# Patient Record
Sex: Male | Born: 1965
Health system: Southern US, Community
[De-identification: ages and names within clinical notes are randomized; demographics above are authoritative.]

---

## 1988-02-18 HISTORY — PX: KNEE ARTHROSCOPY: SHX127

## 2012-08-19 ENCOUNTER — Ambulatory Visit (INDEPENDENT_AMBULATORY_CARE_PROVIDER_SITE_OTHER): Payer: BC Managed Care – PPO | Admitting: Physician Assistant

## 2012-08-19 VITALS — BP 118/72 | HR 81 | Temp 98.1°F | Resp 18 | Ht 67.5 in | Wt 193.4 lb

## 2012-08-19 DIAGNOSIS — L299 Pruritus, unspecified: Secondary | ICD-10-CM

## 2012-08-19 DIAGNOSIS — W57XXXA Bitten or stung by nonvenomous insect and other nonvenomous arthropods, initial encounter: Secondary | ICD-10-CM

## 2012-08-19 DIAGNOSIS — S60569A Insect bite (nonvenomous) of unspecified hand, initial encounter: Secondary | ICD-10-CM

## 2012-08-19 DIAGNOSIS — S60561A Insect bite (nonvenomous) of right hand, initial encounter: Secondary | ICD-10-CM

## 2012-08-19 MED ORDER — CLOBETASOL PROPIONATE 0.05 % EX CREA: TOPICAL_CREAM | Freq: Two times a day (BID) | CUTANEOUS | Status: DC

## 2012-08-19 NOTE — Progress Notes (Signed)
  Subjective:    Patient ID: JOSEY FORCIER, male    DOB: [birth year], 47 y.o.   MRN: [id number]  HPI Gerard Cantara is a 47 YO African American male presenting with an insect bite on his RIGHT wrist.  Occurred yesterday (7/2) at noon.  He saw an insect flying around and he believes it could have gone into his glove.  He felt the bite and took his glove off.  Since then, the area has become red, swollen and extremely itchy.  It is not painful or draining.  He has used nothing topically or systemically for relief.  Denies pain, fever, nausea, vomiting, and sore throat.  Past medical history, surgical history, medications, allergies, social history and family history have been reviewed.  Review of Systems As stated in HPI - otherwise negative.    Objective:   Physical Exam Filed Vitals:   08/19/12 1907  BP: 118/72  Pulse: 81  Temp: 98.1 F (36.7 C)  Resp: 18  General:  WDWN male in no acute distress. Skin:  Two areas with punctate wounds on right dorsum of the hand and right posterior forearm.  Erythematous, edematous, hot, indurated area spreading across the area.      Assessment & Plan:  1. Insect bite hand, right, initial encounter 2. Itching  Recommend patient stay hydrated, keep cool, and take OTC Benadryl at night and Claritin in the morning.    Prescribe:  clobetasol cream (TEMOVATE) 0.05 %; Apply topically 2 (two) times daily.  Dispense: 30 g; Refill: 0.  Call office if symptoms do not steadily improve or any acute worsening including fever.

## 2012-08-19 NOTE — Progress Notes (Signed)
I have examined this patient along with the student and agree.  

## 2012-08-19 NOTE — Patient Instructions (Addendum)
Stay as cool and dry as possible - as getting hot and sweaty will increase your itching.   Stay hydrated to help your body cool.  Keep the right arm/hand elevated as much as possible.   Use OTC Benadryl 50 mg at bedtime.  Use OTC Claritin (Loratadine) 10 mg each morning.  These will help with your itching.   Return if you develop pain, drainage, increasing redness, increasing swelling or if you begin to run a fever above 101F as these may indicate the development of an infection.

## 2015-04-04 ENCOUNTER — Ambulatory Visit (INDEPENDENT_AMBULATORY_CARE_PROVIDER_SITE_OTHER): Payer: BLUE CROSS/BLUE SHIELD | Admitting: Family Medicine

## 2015-04-04 ENCOUNTER — Ambulatory Visit (INDEPENDENT_AMBULATORY_CARE_PROVIDER_SITE_OTHER): Payer: BLUE CROSS/BLUE SHIELD

## 2015-04-04 VITALS — BP 130/88 | HR 94 | Temp 100.4°F | Resp 16 | Ht 67.5 in | Wt 203.2 lb

## 2015-04-04 DIAGNOSIS — K5900 Constipation, unspecified: Secondary | ICD-10-CM

## 2015-04-04 DIAGNOSIS — R197 Diarrhea, unspecified: Secondary | ICD-10-CM

## 2015-04-04 DIAGNOSIS — R6881 Early satiety: Secondary | ICD-10-CM

## 2015-04-04 DIAGNOSIS — R1013 Epigastric pain: Secondary | ICD-10-CM

## 2015-04-04 DIAGNOSIS — K429 Umbilical hernia without obstruction or gangrene: Secondary | ICD-10-CM | POA: Diagnosis not present

## 2015-04-04 DIAGNOSIS — R112 Nausea with vomiting, unspecified: Secondary | ICD-10-CM

## 2015-04-04 DIAGNOSIS — M6208 Separation of muscle (nontraumatic), other site: Secondary | ICD-10-CM

## 2015-04-04 DIAGNOSIS — A09 Infectious gastroenteritis and colitis, unspecified: Secondary | ICD-10-CM

## 2015-04-04 LAB — POCT CBC
Granulocyte percent: 76.2 %G (ref 37–80)
HEMATOCRIT: 39.5 % — AB (ref 43.5–53.7)
HEMOGLOBIN: 13.7 g/dL — AB (ref 14.1–18.1)
LYMPH, POC: 1.6 (ref 0.6–3.4)
MCH, POC: 29.5 pg (ref 27–31.2)
MCHC: 34.6 g/dL (ref 31.8–35.4)
MCV: 85.4 fL (ref 80–97)
MID (CBC): 0.2 (ref 0–0.9)
MPV: 6.7 fL (ref 0–99.8)
POC GRANULOCYTE: 5.6 (ref 2–6.9)
POC LYMPH %: 21.4 % (ref 10–50)
POC MID %: 2.4 % (ref 0–12)
Platelet Count, POC: 265 10*3/uL (ref 142–424)
RBC: 4.63 M/uL — AB (ref 4.69–6.13)
RDW, POC: 13.7 %
WBC: 7.4 10*3/uL (ref 4.6–10.2)

## 2015-04-04 LAB — HEMOCCULT GUIAC POC 1CARD (OFFICE): Fecal Occult Blood, POC: NEGATIVE

## 2015-04-04 MED ORDER — POLYETHYLENE GLYCOL 3350 17 GM/SCOOP PO POWD: ORAL | Status: DC

## 2015-04-04 NOTE — Patient Instructions (Addendum)
Because you received an x-ray today, you will receive an invoice from [facility]. Please contact [facility] at [phone] with questions or concerns regarding your invoice. Our billing staff will not be able to assist you with those questions.  I recommend doing a miralax clean-out. Put 14 (not kidding) doses of miralax (polyethylene glycol) into 64 oz of any clear, non-carbonated liquid (apple juice, gatorade, water) and drink this WITHIN 24 HOURS!!!! For the next 2d, plan to stay home and just hang around the toilet as you will hopefully be having 8 BM/day of LIQUID stool.  If the diarrhea occurs soon after starting process without passing a significant stool volume or if you are having any fecal incontinence you should keep going as this may be the initial miralax washing around the larger stools.  In the meantime, before you start the miralax cleanout on Friday you can try a bottle of magnesium citrate to get things started.   You can also take a large amount of colace/docusate - which works as a Development worker, community but wont push things through. . . I think you are likely beyond this being functional. Senna will also work well to push things through but will cause a lot of abdominal cramping - make it a painful process but effective  Constipation, Adult Constipation is when a person has fewer than three bowel movements a week, has difficulty having a bowel movement, or has stools that are dry, hard, or larger than normal. As people grow older, constipation is more common. A low-fiber diet, not taking in enough fluids, and taking certain medicines may make constipation worse.  CAUSES   Certain medicines, such as antidepressants, pain medicine, iron supplements, antacids, and water pills.   Certain diseases, such as diabetes, irritable bowel syndrome (IBS), thyroid disease, or depression.   Not drinking enough water.   Not eating enough fiber-rich foods.   Stress or travel.    Lack of physical activity or exercise.   Ignoring the urge to have a bowel movement.   Using laxatives too much.  SIGNS AND SYMPTOMS   Having fewer than three bowel movements a week.   Straining to have a bowel movement.   Having stools that are hard, dry, or larger than normal.   Feeling full or bloated.   Pain in the lower abdomen.   Not feeling relief after having a bowel movement.  DIAGNOSIS  Your health care provider will take a medical history and perform a physical exam. Further testing may be done for severe constipation. Some tests may include:  A barium enema X-ray to examine your rectum, colon, and, sometimes, your small intestine.   A sigmoidoscopy to examine your lower colon.   A colonoscopy to examine your entire colon. TREATMENT  Treatment will depend on the severity of your constipation and what is causing it. Some dietary treatments include drinking more fluids and eating more fiber-rich foods. Lifestyle treatments may include regular exercise. If these diet and lifestyle recommendations do not help, your health care provider may recommend taking over-the-counter laxative medicines to help you have bowel movements. Prescription medicines may be prescribed if over-the-counter medicines do not work.  HOME CARE INSTRUCTIONS   Eat foods that have a lot of fiber, such as fruits, vegetables, whole grains, and beans.  Limit foods high in fat and processed sugars, such as french fries, hamburgers, cookies, candies, and soda.   A fiber supplement may be added to your diet if you cannot get enough fiber from  foods.   Drink enough fluids to keep your urine clear or pale yellow.   Exercise regularly or as directed by your health care provider.   Go to the restroom when you have the urge to go. Do not hold it.   Only take over-the-counter or prescription medicines as directed by your health care provider. Do not take other medicines for constipation  without talking to your health care provider first.  SEEK IMMEDIATE MEDICAL CARE IF:   You have bright red blood in your stool.   Your constipation lasts for more than 4 days or gets worse.   You have abdominal or rectal pain.   You have thin, pencil-like stools.   You have unexplained weight loss. MAKE SURE YOU:   Understand these instructions.  Will watch your condition.  Will get help right away if you are not doing well or get worse.   This information is not intended to replace advice given to you by your health care provider. Make sure you discuss any questions you have with your health care provider.   Document Released: 11/02/2003 Document Revised: 02/24/2014 Document Reviewed: 11/15/2012 Elsevier Interactive Patient Education 2016 [employer].  About Constipation  Constipation Overview Constipation is the most common gastrointestinal complaint - about 4 million Americans experience constipation and make 2.5 million physician visits a year to get help for the problem.  Constipation can occur when the colon absorbs too much water, the colon's muscle contraction is slow or sluggish, and/or there is delayed transit time through the colon.  The result is stool that is hard and dry.  Indicators of constipation include straining during bowel movements greater than 25% of the time, having fewer than three bowel movements per week, and/or the feeling of incomplete evacuation.  There are established guidelines ([place] II ) for defining constipation. A person needs to have two or more of the following symptoms for at least 12 weeks (not necessarily consecutive) in the preceding 12 months: . Straining in  greater than 25% of bowel movements . Lumpy or hard stools in greater than 25% of bowel movements . Sensation of incomplete emptying in greater than 25% of bowel movements . Sensation of anorectal obstruction/blockade in greater than 25% of bowel movements . Manual maneuvers to  help empty greater than 25% of bowel movements (e.g., digital evacuation, support of the pelvic floor)  . Less than  3 bowel movements/week . Loose stools are not present, and criteria for irritable bowel syndrome are insufficient  Common Causes of Constipation . Lack of fiber in your diet . Lack of physical activity . Medications, including iron and calcium supplements  . Dairy intake . Dehydration . Abuse of laxatives  Travel  Irritable Bowel Syndrome  Pregnancy  Luteal phase of menstruation (after ovulation and before menses)  Colorectal problems  Intestinal Dysfunction  Treating Constipation  There are several ways of treating constipation, including changes to diet and exercise, use of laxatives, adjustments to the pelvic floor, and scheduled toileting.  These treatments include: . increasing fiber and fluids in the diet  . increasing physical activity . learning muscle coordination   learning proper toileting techniques and toileting modifications   designing and sticking  to a toileting schedule     2007, [employer] Doc.22

## 2015-04-04 NOTE — Progress Notes (Signed)
Subjective:  By signing my name below, I, Stann Ore, attest that this documentation has been prepared under the direction and in the presence of Norberto Sorenson, MD. Electronically Signed: Stann Ore, Scribe. 04/04/2015 , 6:11 PM .  Patient was seen in Room 1 .   Patient ID: Joseph Nolan, male    DOB: 07-Mar-1965, 50 y.o.   MRN: [id number] Chief Complaint  Patient presents with  . Abdominal Pain    x 1 week  . Nausea    x 1 week  . Diarrhea    x 1 week   HPI Joseph Nolan is a 50 y.o. male who presents to [facility] complaining of abdominal pain with nausea and diarrhea that started about 10 days ago. It started with really bad diarrhea (looser and mostly liquid) and then started vomiting. He felt better after 2 days but it returned. He mentions some constipation in between. Now, when he eats, he has cramping abdominal pain (over the epigastric area). His temperature has been up. He denies blood in stool or urinary symptoms. He denies bowel problems in the past. He denies abdomen surgeries. He now gets full really quick and can't eat as much as he used to. He denies weight change.   He's been eating chicken and fish. He has more cramping abdominal pain when he eats cheese.   History reviewed. No pertinent past medical history. Prior to Admission medications   Medication Sig Start Date End Date Taking? Authorizing Provider  clobetasol cream (TEMOVATE) 0.05 % Apply topically 2 (two) times daily. Patient not taking: Reported on 04/04/2015 08/19/12   Porfirio Oar, PA-C  Multiple Vitamin (MULTIVITAMIN) tablet Take 1 tablet by mouth daily. Reported on 04/04/2015    Historical Provider, MD   No Known Allergies  Review of Systems  Constitutional: Positive for fever. Negative for chills, diaphoresis and fatigue.  Cardiovascular: Negative for chest pain.  Gastrointestinal: Positive for nausea, vomiting, abdominal pain, diarrhea and constipation. Negative for blood in stool and anal bleeding.    Genitourinary: Negative for dysuria and hematuria.      Objective:   Physical Exam  Constitutional: He is oriented to person, place, and time. He appears well-developed and well-nourished. No distress.  HENT:  Head: Normocephalic and atraumatic.  Eyes: EOM are normal. Pupils are equal, round, and reactive to light.  Neck: Neck supple.  Cardiovascular: Normal rate, regular rhythm, S1 normal, S2 normal and normal heart sounds.   No murmur heard. Pulmonary/Chest: Effort normal and breath sounds normal. No respiratory distress. He has no wheezes.  Abdominal: Soft. He exhibits no distension. Bowel sounds are decreased. There is no hepatosplenomegaly. There is no CVA tenderness and negative Murphy's sign.  Tympanitic bowel sounds in epigastrium, diastasis recti with umbilical hernia  Musculoskeletal: Normal range of motion.  Neurological: He is alert and oriented to person, place, and time.  Skin: Skin is warm and dry.  Psychiatric: He has a normal mood and affect. His behavior is normal.  Nursing note and vitals reviewed.   BP 130/88 mmHg  Pulse 94  Temp(Src) 100.4 F (38 C) (Oral)  Resp 16  Ht 5' 7.5" (1.715 m)  Wt 203 lb 3.2 oz (92.171 kg)  BMI 31.34 kg/m2  SpO2 97%   Results for orders placed or performed in visit on 04/04/15  POCT CBC  Result Value Ref Range   WBC 7.4 4.6 - 10.2 K/uL   Lymph, poc 1.6 0.6 - 3.4   POC LYMPH PERCENT 21.4 10 -  50 %L   MID (cbc) 0.2 0 - 0.9   POC MID % 2.4 0 - 12 %M   POC Granulocyte 5.6 2 - 6.9   Granulocyte percent 76.2 37 - 80 %G   RBC 4.63 (A) 4.69 - 6.13 M/uL   Hemoglobin 13.7 (A) 14.1 - 18.1 g/dL   HCT, POC 44.0 (A) 10.2 - 53.7 %   MCV 85.4 80 - 97 fL   MCH, POC 29.5 27 - 31.2 pg   MCHC 34.6 31.8 - 35.4 g/dL   RDW, POC 72.5 %   Platelet Count, POC 265 142 - 424 K/uL   MPV 6.7 0 - 99.8 fL   Dg Abd 2 Views  04/04/2015  CLINICAL DATA:  50 year old male with epigastric pain and cramping. EXAM: ABDOMEN - 2 VIEW COMPARISON:  None.  FINDINGS: There is moderate stool throughout the colon. No evidence of bowel obstruction. No free air. No radiopaque calculi. No acute osseous pathology identified. The soft tissues appear unremarkable. IMPRESSION: Constipation.  No bowel obstruction. Electronically Signed   By: Elgie Collard M.D.   On: 04/04/2015 18:29       Assessment & Plan:   1. Abdominal pain, epigastric   2. Non-intractable vomiting with nausea, unspecified vomiting type   3. Diarrhea of presumed infectious origin   4. Early satiety   5. Diastasis recti   6. Umbilical hernia without obstruction and without gangrene   7. Constipation, unspecified constipation type - try miralax cleanout and add in mag citrate prior if needed.    Orders Placed This Encounter  Procedures  . DG Abd 2 Views    Standing Status: Future     Number of Occurrences: 1     Standing Expiration Date: 04/03/2016    Order Specific Question:  Reason for Exam (SYMPTOM  OR DIAGNOSIS REQUIRED)    Answer:  epigastric cramping decreased tympanitic bowel sounds    Order Specific Question:  Preferred imaging location?    Answer:  External  . Comprehensive metabolic panel  . Fecal lactoferrin  . Lipase  . Ambulatory referral to Gastroenterology    Referral Priority:  Routine    Referral Type:  Consultation    Referral Reason:  Specialty Services Required    Number of Visits Requested:  1  . POCT CBC  . POCT occult blood stool    Meds ordered this encounter  Medications  . polyethylene glycol powder (GLYCOLAX/MIRALAX) powder    Sig: Use as directed by physician    Dispense:  500 g    Refill:  1    I personally performed the services described in this documentation, which was scribed in my presence. The recorded information has been reviewed and considered, and addended by me as needed.  Norberto Sorenson, MD MPH

## 2015-04-05 LAB — COMPREHENSIVE METABOLIC PANEL
ALT: 19 U/L (ref 9–46)
AST: 17 U/L (ref 10–40)
Albumin: 4 g/dL (ref 3.6–5.1)
Alkaline Phosphatase: 84 U/L (ref 40–115)
BUN: 12 mg/dL (ref 7–25)
CHLORIDE: 104 mmol/L (ref 98–110)
CO2: 27 mmol/L (ref 20–31)
CREATININE: 1.13 mg/dL (ref 0.60–1.35)
Calcium: 8.8 mg/dL (ref 8.6–10.3)
GLUCOSE: 100 mg/dL — AB (ref 65–99)
Potassium: 4.1 mmol/L (ref 3.5–5.3)
SODIUM: 139 mmol/L (ref 135–146)
TOTAL PROTEIN: 7.1 g/dL (ref 6.1–8.1)
Total Bilirubin: 0.4 mg/dL (ref 0.2–1.2)

## 2015-04-05 LAB — LIPASE: LIPASE: 27 U/L (ref 7–60)

## 2015-04-11 ENCOUNTER — Encounter: Payer: Self-pay | Admitting: Family Medicine

## 2016-07-03 IMAGING — CR DG ABDOMEN 2V
2 series · 2 of 2 positions shown · non-contrast
Comparison: None.

CLINICAL DATA: 49-year-old male with epigastric pain and cramping.

EXAM:
ABDOMEN - 2 VIEW

[AP (1 of 2)]
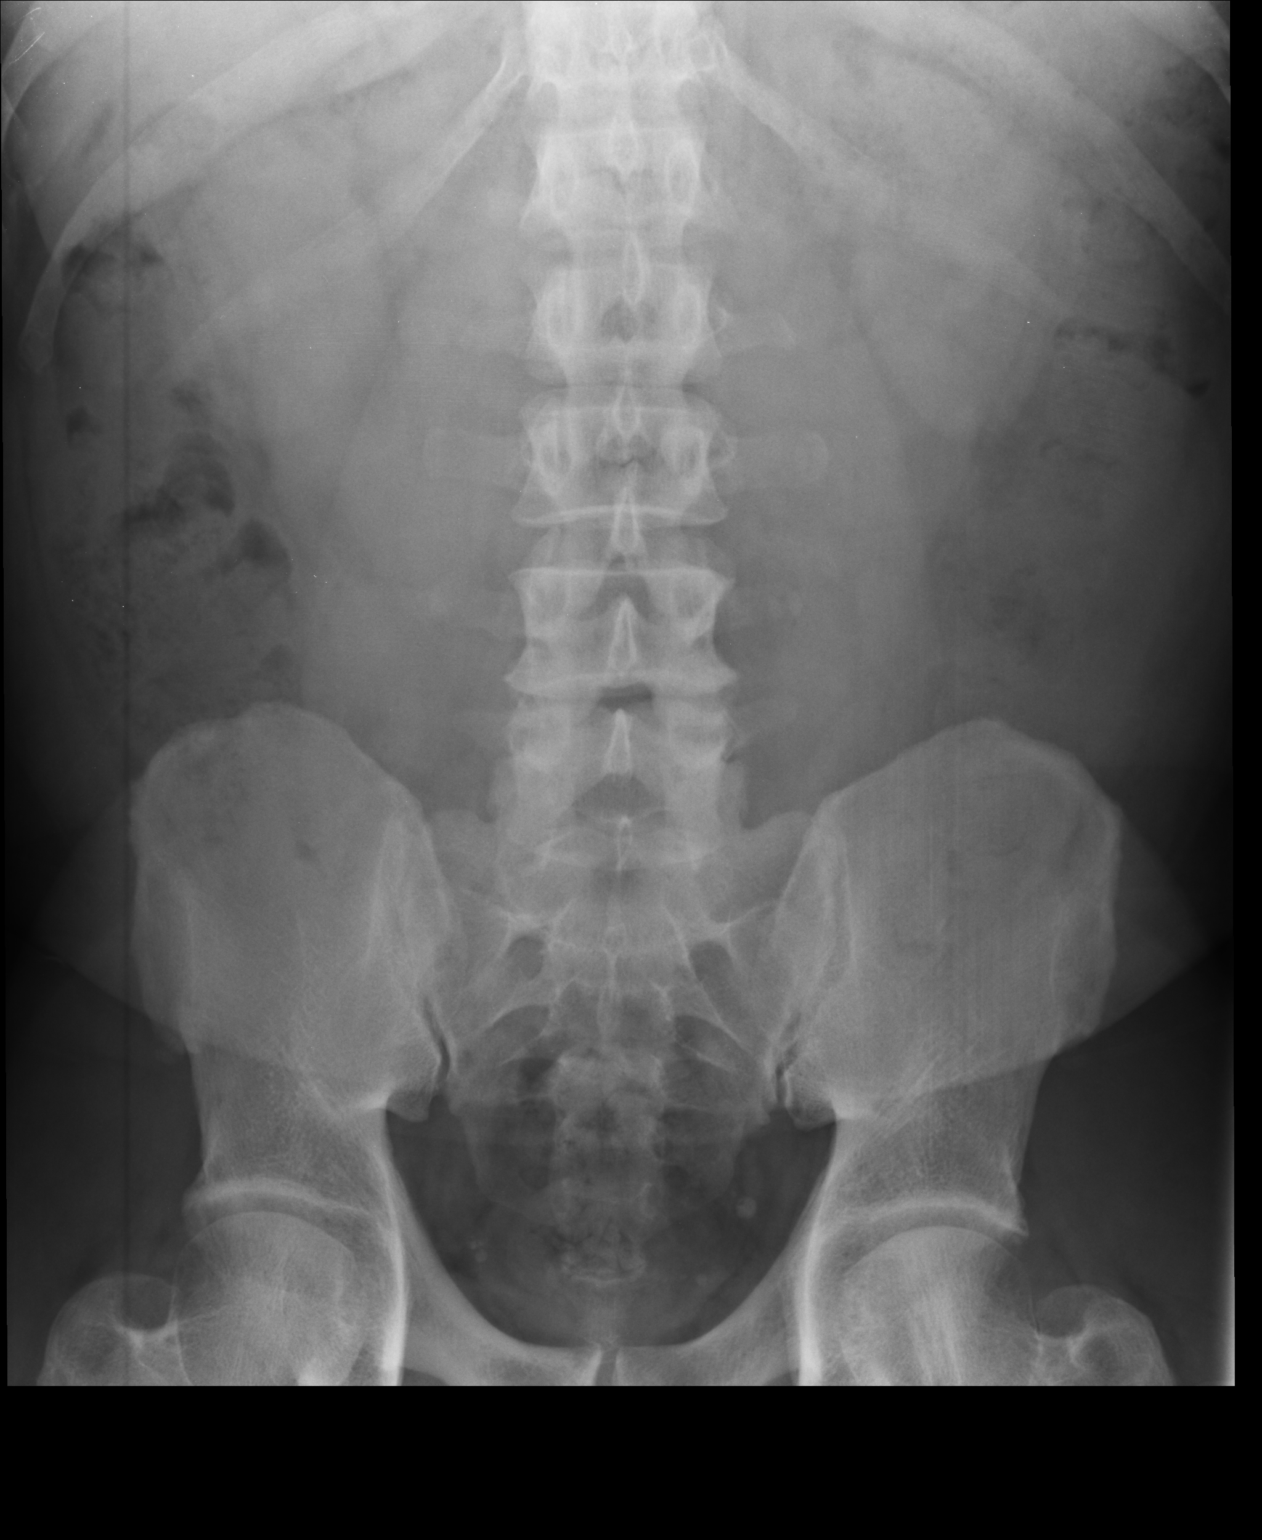

[AP (2 of 2)]
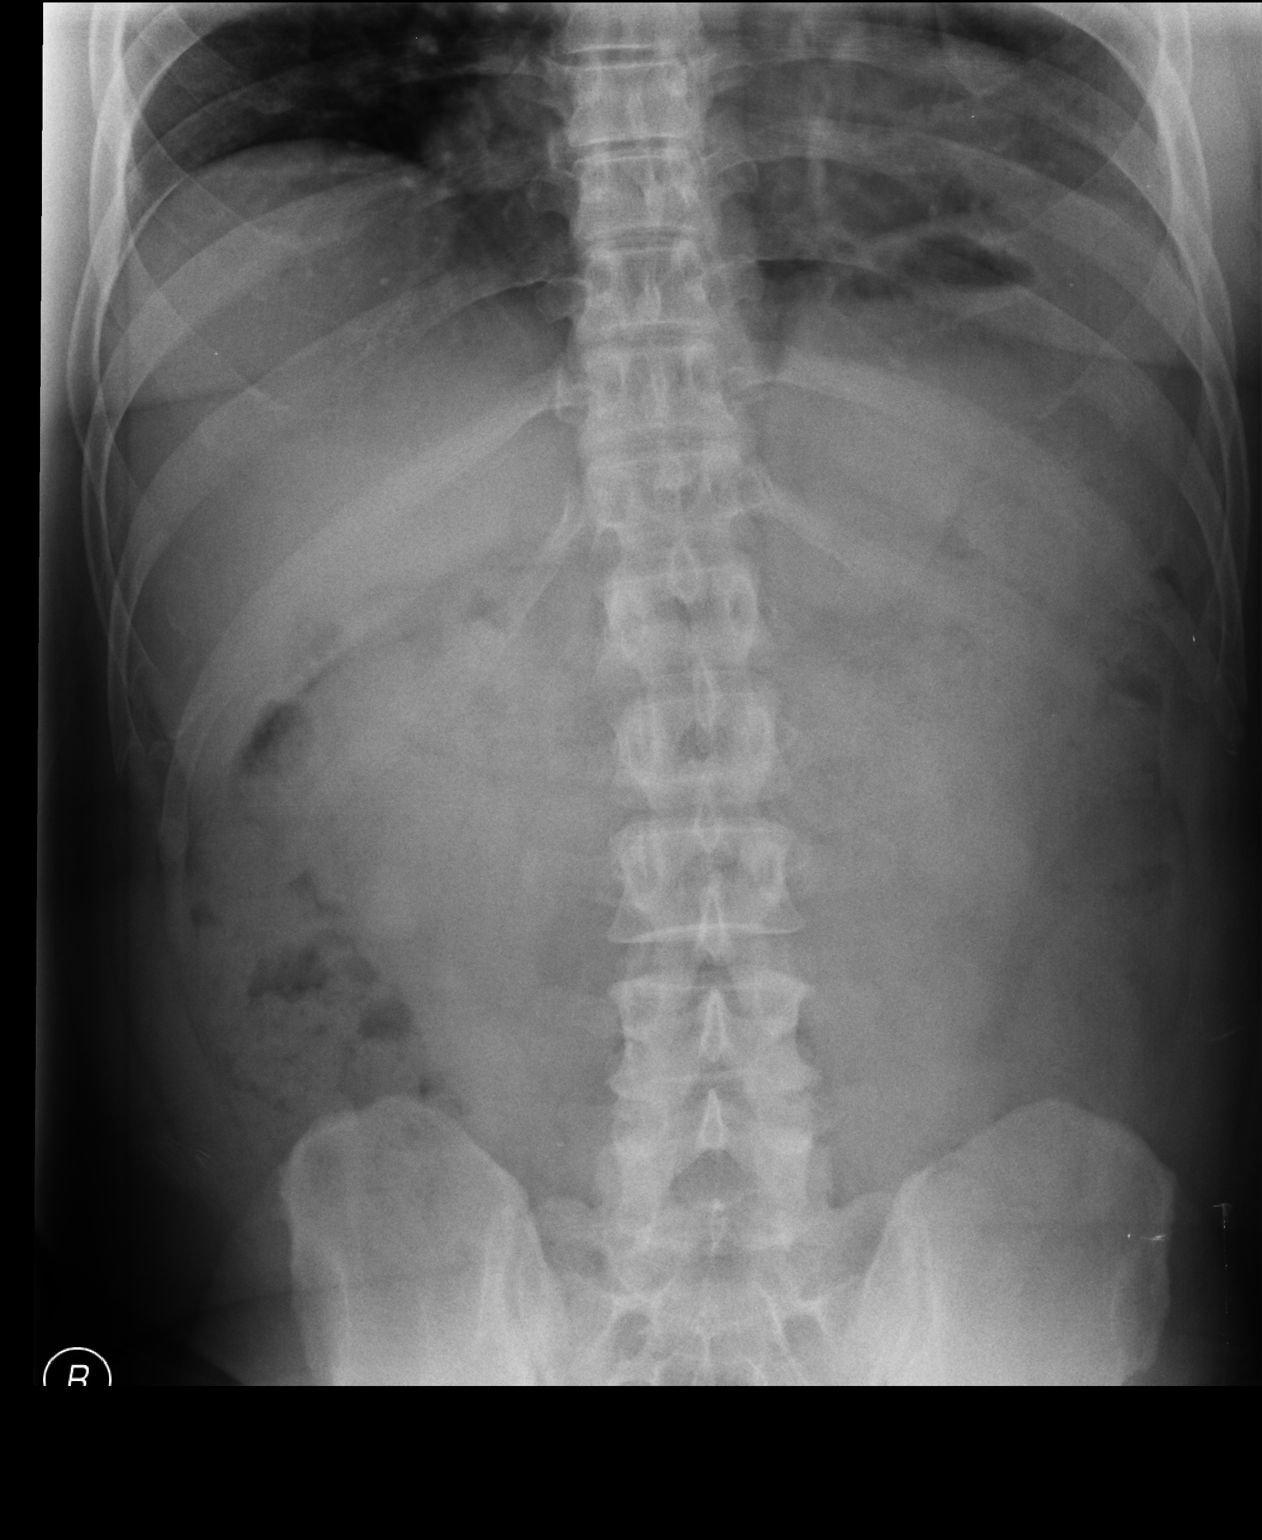

[2 of 2 positions shown; findings below may reference images not displayed]

FINDINGS: There is moderate stool throughout the colon. No evidence of bowel
obstruction. No free air. No radiopaque calculi. No acute osseous
pathology identified. The soft tissues appear unremarkable.
IMPRESSION: Constipation.  No bowel obstruction.

## 2017-04-15 ENCOUNTER — Ambulatory Visit (INDEPENDENT_AMBULATORY_CARE_PROVIDER_SITE_OTHER): Payer: BLUE CROSS/BLUE SHIELD | Admitting: Emergency Medicine

## 2017-04-15 ENCOUNTER — Encounter: Payer: Self-pay | Admitting: Emergency Medicine

## 2017-04-15 VITALS — BP 124/80 | HR 79 | Temp 98.5°F | Resp 17 | Ht 69.0 in | Wt 208.0 lb

## 2017-04-15 DIAGNOSIS — Z0000 Encounter for general adult medical examination without abnormal findings: Secondary | ICD-10-CM

## 2017-04-15 NOTE — Progress Notes (Signed)
Joseph Nolan 52 y.o.   Chief Complaint  Patient presents with  . Annual Exam    HISTORY OF PRESENT ILLNESS: This is a 52 y.o. male Here for annual exam; no complaints and no medical concerns. Smoking: No Drinking: No Sleeping: Adequate Work: Regular hours 10-12 hours a day Exercise: Adequate Stress: Low Nutrition: Average.  Intolerant to red meats.   HPI   Prior to Admission medications   Not on File    No Known Allergies  There are no active problems to display for this patient.   No past medical history on file.  Past Surgical History:  Procedure Laterality Date  . KNEE ARTHROSCOPY  1990    Social History   Socioeconomic History  . Marital status: Single    Spouse name: Not on file  . Number of children: Not on file  . Years of education: Not on file  . Highest education level: Not on file  Social Needs  . Financial resource strain: Not on file  . Food insecurity - worry: Not on file  . Food insecurity - inability: Not on file  . Transportation needs - medical: Not on file  . Transportation needs - non-medical: Not on file  Occupational History    Comment: Lawyer, Truck Driver  Tobacco Use  . Smoking status: Former Smoker    Packs/day: 0.50    Years: 17.00    Pack years: 8.50    Last attempt to quit: 12/21/2003    Years since quitting: 13.3  . Smokeless tobacco: Never Used  Substance and Sexual Activity  . Alcohol use: Yes    Alcohol/week: 0.0 - 1.0 oz  . Drug use: No  . Sexual activity: Not on file  Other Topics Concern  . Not on file  Social History Narrative   Lives at home with Joseph Nolan mother and Joseph Nolan son.    Family History  Problem Relation Age of Onset  . Arthritis Mother   . Cancer Father 32       widespread mets, unknown of original origin  . Cancer Maternal Aunt   . Cancer Maternal Uncle   . Cancer Maternal Aunt   . Cancer Maternal Uncle      Review of Systems  Constitutional: Negative.  Negative for chills, fever  and weight loss.  HENT: Negative.  Negative for congestion, nosebleeds and sore throat.   Eyes: Negative.  Negative for blurred vision and double vision.  Respiratory: Negative.  Negative for cough and shortness of breath.   Cardiovascular: Negative.  Negative for chest pain and palpitations.  Gastrointestinal: Positive for abdominal pain (Occasional epigastric discomfort), diarrhea (After eating red meat) and nausea (Related to red meat ingestion). Negative for blood in stool and melena.  Genitourinary: Negative.  Negative for dysuria and hematuria.  Musculoskeletal: Negative.   Skin: Negative.   Neurological: Negative.  Negative for dizziness and headaches.  Endo/Heme/Allergies: Negative.     Vitals:   04/15/17 1357  BP: 124/80  Pulse: 79  Resp: 17  Temp: 98.5 F (36.9 C)  SpO2: 98%    Physical Exam  Constitutional: Joseph Nolan is oriented to person, place, and time. Joseph Nolan appears well-developed and well-nourished.  HENT:  Head: Normocephalic.  Right Ear: External ear normal.  Left Ear: External ear normal.  Nose: Nose normal.  Mouth/Throat: Oropharynx is clear and moist.  Eyes: Conjunctivae and EOM are normal. Pupils are equal, round, and reactive to light.  Neck: Normal range of motion. Neck supple. No JVD present.  Carotid bruit is not present. No thyromegaly present.  Cardiovascular: Normal rate, regular rhythm and normal heart sounds.  Pulmonary/Chest: Effort normal and breath sounds normal. No respiratory distress.  Abdominal: Soft. Bowel sounds are normal. Joseph Nolan exhibits no distension. There is no tenderness.  Musculoskeletal: Normal range of motion.  Lymphadenopathy:    Joseph Nolan has no cervical adenopathy.  Neurological: Joseph Nolan is alert and oriented to person, place, and time. No sensory deficit. Joseph Nolan exhibits normal muscle tone.  Skin: Skin is warm and dry. Capillary refill takes less than 2 seconds. No rash noted.  Psychiatric: Joseph Nolan has a normal mood and affect. Joseph Nolan behavior is normal.    Vitals reviewed.    ASSESSMENT & PLAN: Joseph Nolan was seen today for annual exam.  Diagnoses and all orders for this visit:  Routine general medical examination at a health care facility -     CBC with Differential -     Comprehensive metabolic panel -     Hemoglobin A1c -     Lipid panel -     PSA(Must document that pt has been informed of limitations of PSA testing.) -     HIV antibody    Patient Instructions       IF you received an x-ray today, you will receive an invoice from [facility]. Please contact [facility] at [phone] with questions or concerns regarding your invoice.   IF you received labwork today, you will receive an invoice from [facility]. Please contact [facility] at [phone] with questions or concerns regarding your invoice.   Our billing staff will not be able to assist you with questions regarding bills from these companies.  You will be contacted with the lab results as soon as they are available. The fastest way to get your results is to activate your My Chart account. Instructions are located on the last page of this paperwork. If you have not heard from [place] regarding the results in 2 weeks, please contact this office.       Health Maintenance, Male A healthy lifestyle and preventive care is important for your health and wellness. Ask your health care provider about what schedule of regular examinations is right for you. What should I know about weight and diet? Eat a Healthy Diet  Eat plenty of vegetables, fruits, whole grains, low-fat dairy products, and lean protein.  Do not eat a lot of foods high in solid fats, added sugars, or salt.  Maintain a Healthy Weight Regular exercise can help you achieve or maintain a healthy weight. You should:  Do at least 150 minutes of exercise each week. The exercise should increase your heart rate and make you sweat (moderate-intensity exercise).  Do strength-training exercises at  least twice a week.  Watch Your Levels of Cholesterol and Blood Lipids  Have your blood tested for lipids and cholesterol every 5 years starting at 50 years of age. If you are at high risk for heart disease, you should start having your blood tested when you are 50 years old. You may need to have your cholesterol levels checked more often if: ? Your lipid or cholesterol levels are high. ? You are older than 50 years of age. ? You are at high risk for heart disease.  What should I know about cancer screening? Many types of cancers can be detected early and may often be prevented. Lung Cancer  You should be screened every year for lung cancer if: ? You are a current smoker who has smoked for  at least 30 years. ? You are a former smoker who has quit within the past 15 years.  Talk to your health care provider about your screening options, when you should start screening, and how often you should be screened.  Colorectal Cancer  Routine colorectal cancer screening usually begins at 50 years of age and should be repeated every 5-10 years until you are 50 years old. You may need to be screened more often if early forms of precancerous polyps or small growths are found. Your health care provider may recommend screening at an earlier age if you have risk factors for colon cancer.  Your health care provider may recommend using home test kits to check for hidden blood in the stool.  A small camera at the end of a tube can be used to examine your colon (sigmoidoscopy or colonoscopy). This checks for the earliest forms of colorectal cancer.  Prostate and Testicular Cancer  Depending on your age and overall health, your health care provider may do certain tests to screen for prostate and testicular cancer.  Talk to your health care provider about any symptoms or concerns you have about testicular or prostate cancer.  Skin Cancer  Check your skin from head to toe regularly.  Tell your health  care provider about any new moles or changes in moles, especially if: ? There is a change in a mole's size, shape, or color. ? You have a mole that is larger than a pencil eraser.  Always use sunscreen. Apply sunscreen liberally and repeat throughout the day.  Protect yourself by wearing long sleeves, pants, a wide-brimmed hat, and sunglasses when outside.  What should I know about heart disease, diabetes, and high blood pressure?  If you are 78-30 years of age, have your blood pressure checked every 3-5 years. If you are 48 years of age or older, have your blood pressure checked every year. You should have your blood pressure measured twice-once when you are at a hospital or clinic, and once when you are not at a hospital or clinic. Record the average of the two measurements. To check your blood pressure when you are not at a hospital or clinic, you can use: ? An automated blood pressure machine at a pharmacy. ? A home blood pressure monitor.  Talk to your health care provider about your target blood pressure.  If you are between 46-24 years old, ask your health care provider if you should take aspirin to prevent heart disease.  Have regular diabetes screenings by checking your fasting blood sugar level. ? If you are at a normal weight and have a low risk for diabetes, have this test once every three years after the age of 86. ? If you are overweight and have a high risk for diabetes, consider being tested at a younger age or more often.  A one-time screening for abdominal aortic aneurysm (AAA) by ultrasound is recommended for men aged 65-75 years who are current or former smokers. What should I know about preventing infection? Hepatitis B If you have a higher risk for hepatitis B, you should be screened for this virus. Talk with your health care provider to find out if you are at risk for hepatitis B infection. Hepatitis C Blood testing is recommended for:  Everyone born from [birth year]  through 1965.  Anyone with known risk factors for hepatitis C.  Sexually Transmitted Diseases (STDs)  You should be screened each year for STDs including gonorrhea and chlamydia if: ? You  are sexually active and are younger than 50 years of age. ? You are older than 50 years of age and your health care provider tells you that you are at risk for this type of infection. ? Your sexual activity has changed since you were last screened and you are at an increased risk for chlamydia or gonorrhea. Ask your health care provider if you are at risk.  Talk with your health care provider about whether you are at high risk of being infected with HIV. Your health care provider may recommend a prescription medicine to help prevent HIV infection.  What else can I do?  Schedule regular health, dental, and eye exams.  Stay current with your vaccines (immunizations).  Do not use any tobacco products, such as cigarettes, chewing tobacco, and e-cigarettes. If you need help quitting, ask your health care provider.  Limit alcohol intake to no more than 2 drinks per day. One drink equals 12 ounces of beer, 5 ounces of wine, or 1 ounces of hard liquor.  Do not use street drugs.  Do not share needles.  Ask your health care provider for help if you need support or information about quitting drugs.  Tell your health care provider if you often feel depressed.  Tell your health care provider if you have ever been abused or do not feel safe at home. This information is not intended to replace advice given to you by your health care provider. Make sure you discuss any questions you have with your health care provider. Document Released: 08/02/2007 Document Revised: 10/03/2015 Document Reviewed: 11/07/2014 Elsevier Interactive Patient Education  2018 [employer].  American Heart Association (AHA) Exercise Recommendation  Being physically active is important to prevent heart disease and stroke, the nation's  No. 1and No. 5killers. To improve overall cardiovascular health, we suggest at least 150 minutes per week of moderate exercise or 75 minutes per week of vigorous exercise (or a combination of moderate and vigorous activity). Thirty minutes a day, five times a week is an easy goal to remember. You will also experience benefits even if you divide your time into two or three segments of 10 to 15 minutes per day.  For people who would benefit from lowering their blood pressure or cholesterol, we recommend 40 minutes of aerobic exercise of moderate to vigorous intensity three to four times a week to lower the risk for heart attack and stroke.  Physical activity is anything that makes you move your body and burn calories.  This includes things like climbing stairs or playing sports. Aerobic exercises benefit your heart, and include walking, jogging, swimming or biking. Strength and stretching exercises are best for overall stamina and flexibility.  The simplest, positive change you can make to effectively improve your heart health is to start walking. It's enjoyable, free, easy, social and great exercise. A walking program is flexible and boasts high success rates because people can stick with it. It's easy for walking to become a regular and satisfying part of life.   For Overall Cardiovascular Health:  At least 30 minutes of moderate-intensity aerobic activity at least 5 days per week for a total of 150  OR   At least 25 minutes of vigorous aerobic activity at least 3 days per week for a total of 75 minutes; or a combination of moderate- and vigorous-intensity aerobic activity  AND   Moderate- to high-intensity muscle-strengthening activity at least 2 days per week for additional health benefits.  For Lowering Blood Pressure  and Cholesterol  An average 40 minutes of moderate- to vigorous-intensity aerobic activity 3 or 4 times per week  What if I can't make it to the time goal? Something is  always better than nothing! And everyone has to start somewhere. Even if you've been sedentary for years, today is the day you can begin to make healthy changes in your life. If you don't think you'll make it for 30 or 40 minutes, set a reachable goal for today. You can work up toward your overall goal by increasing your time as you get stronger. Don't let all-or-nothing thinking rob you of doing what you can every day.  Source:http://www.heart.[place]        Jeshurun [place], MD [facility]

## 2017-04-15 NOTE — Patient Instructions (Addendum)
   IF you received an x-ray today, you will receive an invoice from [facility]. Please contact [facility] at [phone] with questions or concerns regarding your invoice.   IF you received labwork today, you will receive an invoice from [facility]. Please contact [facility] at [phone] with questions or concerns regarding your invoice.   Our billing staff will not be able to assist you with questions regarding bills from these companies.  You will be contacted with the lab results as soon as they are available. The fastest way to get your results is to activate your My Chart account. Instructions are located on the last page of this paperwork. If you have not heard from us regarding the results in 2 weeks, please contact this office.      Health Maintenance, Male A healthy lifestyle and preventive care is important for your health and wellness. Ask your health care provider about what schedule of regular examinations is right for you. What should I know about weight and diet? Eat a Healthy Diet  Eat plenty of vegetables, fruits, whole grains, low-fat dairy products, and lean protein.  Do not eat a lot of foods high in solid fats, added sugars, or salt.  Maintain a Healthy Weight Regular exercise can help you achieve or maintain a healthy weight. You should:  Do at least 150 minutes of exercise each week. The exercise should increase your heart rate and make you sweat (moderate-intensity exercise).  Do strength-training exercises at least twice a week.  Watch Your Levels of Cholesterol and Blood Lipids  Have your blood tested for lipids and cholesterol every 5 years starting at 50 years of age. If you are at high risk for heart disease, you should start having your blood tested when you are 50 years old. You may need to have your cholesterol levels checked more often if: ? Your lipid or cholesterol levels are high. ? You are older than 50 years of age. ? You  are at high risk for heart disease.  What should I know about cancer screening? Many types of cancers can be detected early and may often be prevented. Lung Cancer  You should be screened every year for lung cancer if: ? You are a current smoker who has smoked for at least 30 years. ? You are a former smoker who has quit within the past 15 years.  Talk to your health care provider about your screening options, when you should start screening, and how often you should be screened.  Colorectal Cancer  Routine colorectal cancer screening usually begins at 50 years of age and should be repeated every 5-10 years until you are 50 years old. You may need to be screened more often if early forms of precancerous polyps or small growths are found. Your health care provider may recommend screening at an earlier age if you have risk factors for colon cancer.  Your health care provider may recommend using home test kits to check for hidden blood in the stool.  A small camera at the end of a tube can be used to examine your colon (sigmoidoscopy or colonoscopy). This checks for the earliest forms of colorectal cancer.  Prostate and Testicular Cancer  Depending on your age and overall health, your health care provider may do certain tests to screen for prostate and testicular cancer.  Talk to your health care provider about any symptoms or concerns you have about testicular or prostate cancer.  Skin Cancer  Check your skin   from head to toe regularly.  Tell your health care provider about any new moles or changes in moles, especially if: ? There is a change in a mole's size, shape, or color. ? You have a mole that is larger than a pencil eraser.  Always use sunscreen. Apply sunscreen liberally and repeat throughout the day.  Protect yourself by wearing long sleeves, pants, a wide-brimmed hat, and sunglasses when outside.  What should I know about heart disease, diabetes, and high blood  pressure?  If you are 18-39 years of age, have your blood pressure checked every 3-5 years. If you are 40 years of age or older, have your blood pressure checked every year. You should have your blood pressure measured twice-once when you are at a hospital or clinic, and once when you are not at a hospital or clinic. Record the average of the two measurements. To check your blood pressure when you are not at a hospital or clinic, you can use: ? An automated blood pressure machine at a pharmacy. ? A home blood pressure monitor.  Talk to your health care provider about your target blood pressure.  If you are between 45-79 years old, ask your health care provider if you should take aspirin to prevent heart disease.  Have regular diabetes screenings by checking your fasting blood sugar level. ? If you are at a normal weight and have a low risk for diabetes, have this test once every three years after the age of 45. ? If you are overweight and have a high risk for diabetes, consider being tested at a younger age or more often.  A one-time screening for abdominal aortic aneurysm (AAA) by ultrasound is recommended for men aged 65-75 years who are current or former smokers. What should I know about preventing infection? Hepatitis B If you have a higher risk for hepatitis B, you should be screened for this virus. Talk with your health care provider to find out if you are at risk for hepatitis B infection. Hepatitis C Blood testing is recommended for:  Everyone born from 1945 through 1965.  Anyone with known risk factors for hepatitis C.  Sexually Transmitted Diseases (STDs)  You should be screened each year for STDs including gonorrhea and chlamydia if: ? You are sexually active and are younger than 50 years of age. ? You are older than 50 years of age and your health care provider tells you that you are at risk for this type of infection. ? Your sexual activity has changed since you were last  screened and you are at an increased risk for chlamydia or gonorrhea. Ask your health care provider if you are at risk.  Talk with your health care provider about whether you are at high risk of being infected with HIV. Your health care provider may recommend a prescription medicine to help prevent HIV infection.  What else can I do?  Schedule regular health, dental, and eye exams.  Stay current with your vaccines (immunizations).  Do not use any tobacco products, such as cigarettes, chewing tobacco, and e-cigarettes. If you need help quitting, ask your health care provider.  Limit alcohol intake to no more than 2 drinks per day. One drink equals 12 ounces of beer, 5 ounces of wine, or 1 ounces of hard liquor.  Do not use street drugs.  Do not share needles.  Ask your health care provider for help if you need support or information about quitting drugs.  Tell your health care   provider if you often feel depressed.  Tell your health care provider if you have ever been abused or do not feel safe at home. This information is not intended to replace advice given to you by your health care provider. Make sure you discuss any questions you have with your health care provider. Document Released: 08/02/2007 Document Revised: 10/03/2015 Document Reviewed: 11/07/2014 Elsevier Interactive Patient Education  2018 [employer]  American Heart Association (AHA) Exercise Recommendation  Being physically active is important to prevent heart disease and stroke, the nation's No. 1and No. 5killers. To improve overall cardiovascular health, we suggest at least 150 minutes per week of moderate exercise or 75 minutes per week of vigorous exercise (or a combination of moderate and vigorous activity). Thirty minutes a day, five times a week is an easy goal to remember. You will also experience benefits even if you divide your time into two or three segments of 10 to 15 minutes per day.  For people who would  benefit from lowering their blood pressure or cholesterol, we recommend 40 minutes of aerobic exercise of moderate to vigorous intensity three to four times a week to lower the risk for heart attack and stroke.  Physical activity is anything that makes you move your body and burn calories.  This includes things like climbing stairs or playing sports. Aerobic exercises benefit your heart, and include walking, jogging, swimming or biking. Strength and stretching exercises are best for overall stamina and flexibility.  The simplest, positive change you can make to effectively improve your heart health is to start walking. It's enjoyable, free, easy, social and great exercise. A walking program is flexible and boasts high success rates because people can stick with it. It's easy for walking to become a regular and satisfying part of life.   For Overall Cardiovascular Health:  At least 30 minutes of moderate-intensity aerobic activity at least 5 days per week for a total of 150  OR   At least 25 minutes of vigorous aerobic activity at least 3 days per week for a total of 75 minutes; or a combination of moderate- and vigorous-intensity aerobic activity  AND   Moderate- to high-intensity muscle-strengthening activity at least 2 days per week for additional health benefits.  For Lowering Blood Pressure and Cholesterol  An average 40 minutes of moderate- to vigorous-intensity aerobic activity 3 or 4 times per week  What if I can't make it to the time goal? Something is always better than nothing! And everyone has to start somewhere. Even if you've been sedentary for years, today is the day you can begin to make healthy changes in your life. If you don't think you'll make it for 30 or 40 minutes, set a reachable goal for today. You can work up toward your overall goal by increasing your time as you get stronger. Don't let all-or-nothing thinking rob you of doing what you can every day.   Source:http://www.heart.org    

## 2017-04-16 LAB — CBC WITH DIFFERENTIAL/PLATELET
BASOS ABS: 0 10*3/uL (ref 0.0–0.2)
Basos: 0 %
EOS (ABSOLUTE): 0.1 10*3/uL (ref 0.0–0.4)
Eos: 2 %
Hematocrit: 40 % (ref 37.5–51.0)
Hemoglobin: 13.8 g/dL (ref 13.0–17.7)
Immature Grans (Abs): 0 10*3/uL (ref 0.0–0.1)
Immature Granulocytes: 0 %
LYMPHS ABS: 1.7 10*3/uL (ref 0.7–3.1)
Lymphs: 30 %
MCH: 29.5 pg (ref 26.6–33.0)
MCHC: 34.5 g/dL (ref 31.5–35.7)
MCV: 86 fL (ref 79–97)
Monocytes Absolute: 0.5 10*3/uL (ref 0.1–0.9)
Monocytes: 9 %
NEUTROS ABS: 3.3 10*3/uL (ref 1.4–7.0)
Neutrophils: 59 %
PLATELETS: 284 10*3/uL (ref 150–379)
RBC: 4.68 x10E6/uL (ref 4.14–5.80)
RDW: 13.7 % (ref 12.3–15.4)
WBC: 5.7 10*3/uL (ref 3.4–10.8)

## 2017-04-16 LAB — COMPREHENSIVE METABOLIC PANEL
A/G RATIO: 1.4 (ref 1.2–2.2)
ALBUMIN: 4.5 g/dL (ref 3.5–5.5)
ALT: 28 IU/L (ref 0–44)
AST: 18 IU/L (ref 0–40)
Alkaline Phosphatase: 91 IU/L (ref 39–117)
BILIRUBIN TOTAL: 0.4 mg/dL (ref 0.0–1.2)
BUN / CREAT RATIO: 12 (ref 9–20)
BUN: 13 mg/dL (ref 6–24)
CHLORIDE: 104 mmol/L (ref 96–106)
CO2: 26 mmol/L (ref 20–29)
Calcium: 9.4 mg/dL (ref 8.7–10.2)
Creatinine, Ser: 1.1 mg/dL (ref 0.76–1.27)
GFR calc non Af Amer: 78 mL/min/{1.73_m2} (ref >59)
GFR, EST AFRICAN AMERICAN: 91 mL/min/{1.73_m2} (ref >59)
GLUCOSE: 86 mg/dL (ref 65–99)
Globulin, Total: 3.2 g/dL (ref 1.5–4.5)
POTASSIUM: 4.3 mmol/L (ref 3.5–5.2)
Sodium: 141 mmol/L (ref 134–144)
TOTAL PROTEIN: 7.7 g/dL (ref 6.0–8.5)

## 2017-04-16 LAB — PSA: PROSTATE SPECIFIC AG, SERUM: 0.6 ng/mL (ref 0.0–4.0)

## 2017-04-16 LAB — LIPID PANEL
CHOL/HDL RATIO: 4 ratio (ref 0.0–5.0)
Cholesterol, Total: 166 mg/dL (ref 100–199)
HDL: 42 mg/dL (ref >39)
LDL CALC: 99 mg/dL (ref 0–99)
TRIGLYCERIDES: 126 mg/dL (ref 0–149)
VLDL Cholesterol Cal: 25 mg/dL (ref 5–40)

## 2017-04-16 LAB — HIV ANTIBODY (ROUTINE TESTING W REFLEX): HIV SCREEN 4TH GENERATION: NONREACTIVE

## 2017-04-16 LAB — HEMOGLOBIN A1C
Est. average glucose Bld gHb Est-mCnc: 117 mg/dL
Hgb A1c MFr Bld: 5.7 % — ABNORMAL HIGH (ref 4.8–5.6)

## 2017-04-17 NOTE — Progress Notes (Signed)
Letter sent.

## 2018-07-17 ENCOUNTER — Other Ambulatory Visit: Payer: Self-pay

## 2018-07-17 ENCOUNTER — Emergency Department (HOSPITAL_COMMUNITY)
Admission: EM | Admit: 2018-07-17 | Discharge: 2018-07-18 | Disposition: A | Discharge status: Home / Self Care | Payer: BLUE CROSS/BLUE SHIELD | Attending: Emergency Medicine | Admitting: Emergency Medicine

## 2018-07-17 ENCOUNTER — Encounter (HOSPITAL_COMMUNITY): Payer: Self-pay

## 2018-07-17 DIAGNOSIS — R112 Nausea with vomiting, unspecified: Secondary | ICD-10-CM | POA: Diagnosis not present

## 2018-07-17 DIAGNOSIS — I10 Essential (primary) hypertension: Secondary | ICD-10-CM | POA: Diagnosis not present

## 2018-07-17 DIAGNOSIS — R55 Syncope and collapse: Secondary | ICD-10-CM | POA: Diagnosis not present

## 2018-07-17 DIAGNOSIS — R110 Nausea: Secondary | ICD-10-CM | POA: Diagnosis not present

## 2018-07-17 DIAGNOSIS — R001 Bradycardia, unspecified: Secondary | ICD-10-CM | POA: Diagnosis not present

## 2018-07-17 DIAGNOSIS — R0902 Hypoxemia: Secondary | ICD-10-CM | POA: Diagnosis not present

## 2018-07-17 DIAGNOSIS — R1111 Vomiting without nausea: Secondary | ICD-10-CM | POA: Diagnosis not present

## 2018-07-17 NOTE — ED Triage Notes (Signed)
Pt with c/o nausea and vomiting after eating [place] food tonight. Denies abd pain. Zofran 4 mg IM to left deltoid PTA by EMS. Pt states relief from med

## 2018-07-17 NOTE — ED Notes (Signed)
Bed: WA17 Expected date:  Expected time:  Means of arrival:  Comments: EMS 53 yo male from home generalized weakness/nausea and vomiting after eating tonight-Zofran IV

## 2018-07-18 ENCOUNTER — Other Ambulatory Visit: Payer: Self-pay

## 2018-07-18 LAB — COMPREHENSIVE METABOLIC PANEL
ALT: 33 U/L (ref 0–44)
AST: 23 U/L (ref 15–41)
Albumin: 4.5 g/dL (ref 3.5–5.0)
Alkaline Phosphatase: 68 U/L (ref 38–126)
Anion gap: 8 (ref 5–15)
BUN: 17 mg/dL (ref 6–20)
CO2: 27 mmol/L (ref 22–32)
Calcium: 9.4 mg/dL (ref 8.9–10.3)
Chloride: 105 mmol/L (ref 98–111)
Creatinine, Ser: 1.08 mg/dL (ref 0.61–1.24)
GFR calc Af Amer: >60 mL/min (ref >60)
GFR calc non Af Amer: >60 mL/min (ref >60)
Glucose, Bld: 112 mg/dL — ABNORMAL HIGH (ref 70–99)
Potassium: 3.8 mmol/L (ref 3.5–5.1)
Sodium: 140 mmol/L (ref 135–145)
Total Bilirubin: 0.4 mg/dL (ref 0.3–1.2)
Total Protein: 8.5 g/dL — ABNORMAL HIGH (ref 6.5–8.1)

## 2018-07-18 LAB — LIPASE, BLOOD: Lipase: 32 U/L (ref 11–51)

## 2018-07-18 LAB — CBC WITH DIFFERENTIAL/PLATELET
Abs Immature Granulocytes: 0.09 10*3/uL — ABNORMAL HIGH (ref 0.00–0.07)
Basophils Absolute: 0 10*3/uL (ref 0.0–0.1)
Basophils Relative: 0 %
Eosinophils Absolute: 0.1 10*3/uL (ref 0.0–0.5)
Eosinophils Relative: 0 %
HCT: 44.8 % (ref 39.0–52.0)
Hemoglobin: 15.1 g/dL (ref 13.0–17.0)
Immature Granulocytes: 1 %
Lymphocytes Relative: 9 %
Lymphs Abs: 1.2 10*3/uL (ref 0.7–4.0)
MCH: 30 pg (ref 26.0–34.0)
MCHC: 33.7 g/dL (ref 30.0–36.0)
MCV: 89.1 fL (ref 80.0–100.0)
Monocytes Absolute: 0.8 10*3/uL (ref 0.1–1.0)
Monocytes Relative: 6 %
Neutro Abs: 11.2 10*3/uL — ABNORMAL HIGH (ref 1.7–7.7)
Neutrophils Relative %: 84 %
Platelets: 247 10*3/uL (ref 150–400)
RBC: 5.03 MIL/uL (ref 4.22–5.81)
RDW: 12.5 % (ref 11.5–15.5)
WBC: 13.4 10*3/uL — ABNORMAL HIGH (ref 4.0–10.5)
nRBC: 0 % (ref 0.0–0.2)

## 2018-07-18 MED ORDER — SODIUM CHLORIDE 0.9 % IV BOLUS (SEPSIS)
1000.0000 mL | Freq: Once | INTRAVENOUS | Status: AC
  Administered 2018-07-18: 1000 mL via INTRAVENOUS

## 2018-07-18 MED ORDER — ONDANSETRON HCL 4 MG/2ML IJ SOLN
4.0000 mg | Freq: Once | INTRAMUSCULAR | Status: AC
  Administered 2018-07-18: 4 mg via INTRAVENOUS
  Filled 2018-07-18: qty 2

## 2018-07-18 NOTE — Discharge Instructions (Addendum)
°  SEEK IMMEDIATE MEDICAL ATTENTION IF: °A temperature above 100.4F develops.  °Repeated vomiting occurs (multiple episodes).  °The pain becomes localized to portions of the abdomen. The right side could possibly be appendicitis. In an adult, the left lower portion of the abdomen could be colitis or diverticulitis.  °Blood is being passed in stools or vomit (bright red or black tarry stools).  °Return also if you develop chest pain, difficulty breathing, dizziness or fainting, or become confused, poorly responsive, or inconsolable. ° °

## 2018-07-18 NOTE — ED Provider Notes (Signed)
[facility]-EMERGENCY DEPT Provider Note   CSN: [id number] Arrival date & time: 07/17/18  2330    History   Chief Complaint Chief Complaint  Patient presents with  . Nausea    HPI Joseph Nolan is a 53 y.o. male.     The history is provided by the patient.  Emesis  Severity:  Severe Timing:  Constant Progression:  Improving Chronicity:  New Relieved by:  Antiemetics Worsened by:  Nothing Associated symptoms: chills   Associated symptoms: no abdominal pain, no cough and no fever   Risk factors: suspect food intake   Patient without any medical conditions presents with vomiting. He reports he ate [place] food at home tonight.  He had 1 glass of wine.  Approximately 45 minutes later he had onset of nausea/vomiting.  He had intense dizziness.  One episode of partial diarrhea.  No blood in vomit or stool.  He reports multiple episodes of vomiting.  No abdominal pain.  No chest pain. Reports his dizziness was so severe he had difficulty standing up.  No visual or hearing changes  PMH-none Past Surgical History:  Procedure Laterality Date  . KNEE ARTHROSCOPY  1990        Home Medications    Prior to Admission medications   Not on File    Family History Family History  Problem Relation Age of Onset  . Arthritis Mother   . Cancer Father 63       widespread mets, unknown of original origin  . Cancer Maternal Aunt   . Cancer Maternal Uncle   . Cancer Maternal Aunt   . Cancer Maternal Uncle     Social History Social History   Tobacco Use  . Smoking status: Former Smoker    Packs/day: 0.50    Years: 17.00    Pack years: 8.50    Last attempt to quit: 12/21/2003    Years since quitting: 14.5  . Smokeless tobacco: Never Used  Substance Use Topics  . Alcohol use: Yes    Alcohol/week: 0.0 - 2.0 standard drinks  . Drug use: No     Allergies   Patient has no known allergies.   Review of Systems Review of Systems  Constitutional:  Positive for chills. Negative for fever.  HENT: Negative for hearing loss.   Eyes: Negative for visual disturbance.  Respiratory: Negative for cough.   Gastrointestinal: Positive for vomiting. Negative for abdominal pain.  All other systems reviewed and are negative.    Physical Exam Updated Vital Signs BP (!) 141/91 (BP Location: Right Arm)   Pulse 76   Temp 98.2 F (36.8 C) (Oral)   Resp 18   Ht 1.702 m (5\' 7" )   Wt 93 kg   SpO2 97%   BMI 32.11 kg/m   Physical Exam  CONSTITUTIONAL: Well developed/well nourished HEAD: Normocephalic/atraumatic EYES: EOMI/PERRL, no nystagmus ENMT: Mask in place NECK: supple no meningeal signs SPINE/BACK:entire spine nontender CV: S1/S2 noted, no murmurs/rubs/gallops noted LUNGS: Lungs are clear to auscultation bilaterally, no apparent distress ABDOMEN: soft, nontender, no rebound or guarding, bowel sounds noted throughout abdomen GU:no cva tenderness NEURO: Pt is awake/alert/appropriate, moves all extremitiesx4.  No facial droop.  No arm or leg drift.  Equal strength noted throughout extremities EXTREMITIES: pulses normal/equal, full ROM SKIN: warm, color normal PSYCH: no abnormalities of mood noted, alert and oriented to situation  ED Treatments / Results  Labs (all labs ordered are listed, but only abnormal results are displayed) Labs Reviewed  CBC WITH DIFFERENTIAL/PLATELET - Abnormal; Notable for the following components:      Result Value   WBC 13.4 (*)    Neutro Abs 11.2 (*)    Abs Immature Granulocytes 0.09 (*)    All other components within normal limits  COMPREHENSIVE METABOLIC PANEL - Abnormal; Notable for the following components:   Glucose, Bld 112 (*)    Total Protein 8.5 (*)    All other components within normal limits  LIPASE, BLOOD    EKG EKG Interpretation  Date/Time:  Sunday Jul 18 2018 00:38:16 EDT Ventricular Rate:  73 PR Interval:    QRS Duration: 100 QT Interval:  399 QTC Calculation: 440 R Axis:    -12 Text Interpretation:  Sinus rhythm RSR' in V1 or V2, right VCD or RVH No previous ECGs available Confirmed by Zadie RhineWickline, Yanel Dombrosky ([id number]) on 07/18/2018 12:53:28 AM   Radiology No results found.  Procedures Procedures  Medications Ordered in ED Medications  sodium chloride 0.9 % bolus 1,000 mL (1,000 mLs Intravenous New Bag/Given 07/18/18 0127)  ondansetron (ZOFRAN) injection 4 mg (4 mg Intravenous Given 07/18/18 0126)     Initial Impression / Assessment and Plan / ED Course  I have reviewed the triage vital signs and the nursing notes.  Pertinent labs results that were available during my care of the patient were reviewed by me and considered in my medical decision making (see chart for details).        1:10 AM Patient reports multiple episodes of vomiting and some loose stool after eating [place] food.  He is already feeling improved.  No focal weakness noted.  EKG is unremarkable.  Will hydrate and reassess.  Labs are pending 3:19 AM Labs overall reassuring.  Patient feels improved with fluids Is able to stand and felt well.  No dizziness.  Strong suspicion this was food related. He feels comfortable for discharge home.  He has no focal abdominal tenderness.  We discussed return precautions Final Clinical Impressions(s) / ED Diagnoses   Final diagnoses:  Non-intractable vomiting with nausea, unspecified vomiting type  Near syncope    ED Discharge Orders    None       Zadie RhineWickline, Barnard Sharps, MD 07/18/18 [phone]

## 2022-01-06 ENCOUNTER — Encounter: Payer: Self-pay | Admitting: Dietician

## 2022-01-06 ENCOUNTER — Encounter: Payer: No Typology Code available for payment source | Attending: Neurology | Admitting: Dietician

## 2022-01-06 NOTE — Patient Instructions (Addendum)
Aim for 150 minutes of physical activity weekly. Goal: aim to go to the gym for 45 minutes 2 days a week.   Eat more Non-Starchy Vegetables and Fruits. Goal: aim to make 1/2 of your plate vegetables and/or fruit at least 2x/day  Minimize added sugars and refined grains. Rethink what you drink. Choose beverages without added sugar. Look for 0 carbs on the label. Goal: stop drinking fruit punch  Choose whole foods over processed.  Make simple meals at home more often than eating out.  When eating out choose bun/muffin over biscuit or croissant

## 2022-01-06 NOTE — Progress Notes (Signed)
Medical Nutrition Therapy  Appointment Start time:  [phone]  Appointment End time:  1652  Primary concerns today: Pt states he wants to lose weight in his belly fat area. He has found that he's been gaining weight in this area and wants to lose it. Pt also is concerned about his blood pressure and prediabetes.    Referral diagnosis: E66.01 Preferred learning style: no preference indicated Learning readiness: ready   NUTRITION ASSESSMENT   Anthropometrics  Ht: 67in Wt: not assessed  Clinical Medical Hx: HTN, prediabetes.  Medications: reviewed Labs: 12/09/21: A1c 6.0 Notable Signs/Symptoms: none Food Allergies: none  Lifestyle & Dietary Hx  Pt lives with wife. Pt's wife does shopping and cooking.   Pt states he isn't able to exercise as much as he used to due to his knee injury. Pt does pushups and upper body exercise some days.   Pt works 12:30am to 11:30am. 3 days on 2 days off. Pt states he often works 4 days on and 1 day off in order to get overtime.   Pt states on work days he usually just snacks such as sandwich, peanuts, peanut butter crackers, m&m's sometimes. On days off he tends to have 2 meals because he wakes up later.   Pt wakes up 10am on off days.   Pt is a truck driver for [employer] and is able to get discounts on the cold bar including fruits and vegetables.   Estimated daily fluid intake: 64-72 oz Supplements: turmeric, apple cider vinegar, vitamin d3, MVI Sleep: 4-5 hours Stress / self-care: mild stress Current average weekly physical activity: ADLs, some push ups  24-Hr Dietary Recall Workday First Meal: none Snack: 3am: peanuts or crackers Second Meal: 5am: sandwich with grilled chicken and bacon and egg on croissant Snack: 9am: crackers Third Meal: 12pm: zaxbys sandwich with fries and drink Snack: 8pm: meat and starch and/or vegetable Beverages: water, 3 fruit punch a week  24 hour recall at home: First Meal: 11am wendys breakfast with sausage egg  and bacon biscuit with fruit punch Snack: none Second Meal: none Snack: none Third Meal: meat starch and vegetables Snack: none Beverages: water and fruit punch   NUTRITION DIAGNOSIS  NB-1.1 Food and nutrition-related knowledge deficit As related to lack of prior nutrition education.  As evidenced by pt report and diet history.   NUTRITION INTERVENTION  Nutrition education (E-1) on the following topics:  Saturated vs unsaturated fat Omega 3 sources A1c and goal Prediabetes Insulin resistance Building balanced meals and snacks Physical activity goals Impact of physical activity on health  Impact of sodium on blood pressure Hypertension nutrition therapy  Handouts Provided Include  MyPlate Nutrition Care Manual: Hypertension Nutrition Therapy A1C chart Balanced Plate Food Groups  Learning Style & Readiness for Change Teaching method utilized: Visual & Auditory  Demonstrated degree of understanding via: Teach Back  Barriers to learning/adherence to lifestyle change: none  Goals Established by Pt Aim for 150 minutes of physical activity weekly. Goal: aim to go to the gym for 45 minutes 2 days a week.   Eat more Non-Starchy Vegetables and Fruits. Goal: aim to make 1/2 of your plate vegetables and/or fruit at least 2x/day  Minimize added sugars and refined grains. Rethink what you drink. Choose beverages without added sugar. Look for 0 carbs on the label. Goal: stop drinking fruit punch  Choose whole foods over processed.  Make simple meals at home more often than eating out.  When eating out choose bun/muffin over biscuit or croissant  MONITORING & EVALUATION Dietary intake, weekly physical activity, and follow up in 3 months.  Next Steps  Patient is to call for questions.

## 2022-01-16 ENCOUNTER — Ambulatory Visit: Payer: BLUE CROSS/BLUE SHIELD | Admitting: Dietician

## 2022-04-14 ENCOUNTER — Ambulatory Visit: Payer: Non-veteran care | Admitting: Dietician

## 2022-05-01 ENCOUNTER — Ambulatory Visit: Payer: Non-veteran care | Admitting: Dietician
